# Patient Record
Sex: Female | Born: 1965 | Race: White | Hispanic: No | Marital: Married | Smoking: Current some day smoker
Health system: Southern US, Community
[De-identification: ages and names within clinical notes are randomized; demographics above are authoritative.]

## PROBLEM LIST (undated history)

## (undated) DIAGNOSIS — L659 Nonscarring hair loss, unspecified: Secondary | ICD-10-CM

## (undated) DIAGNOSIS — F909 Attention-deficit hyperactivity disorder, unspecified type: Secondary | ICD-10-CM

---

## 1998-06-12 ENCOUNTER — Encounter: Payer: Self-pay | Admitting: Internal Medicine

## 1998-06-12 ENCOUNTER — Emergency Department (HOSPITAL_COMMUNITY): Admission: EM | Admit: 1998-06-12 | Discharge: 1998-06-12 | Payer: Self-pay | Admitting: Internal Medicine

## 1998-12-22 ENCOUNTER — Other Ambulatory Visit: Admission: RE | Admit: 1998-12-22 | Discharge: 1998-12-22 | Payer: Self-pay | Admitting: Obstetrics and Gynecology

## 1999-02-28 ENCOUNTER — Ambulatory Visit (HOSPITAL_COMMUNITY): Admission: RE | Admit: 1999-02-28 | Discharge: 1999-02-28 | Payer: Self-pay | Admitting: Obstetrics and Gynecology

## 1999-02-28 ENCOUNTER — Encounter (INDEPENDENT_AMBULATORY_CARE_PROVIDER_SITE_OTHER): Payer: Self-pay

## 1999-03-15 ENCOUNTER — Other Ambulatory Visit: Admission: RE | Admit: 1999-03-15 | Discharge: 1999-03-15 | Payer: Self-pay | Admitting: Obstetrics and Gynecology

## 1999-12-13 ENCOUNTER — Encounter: Payer: Self-pay | Admitting: Obstetrics and Gynecology

## 1999-12-13 ENCOUNTER — Ambulatory Visit (HOSPITAL_COMMUNITY): Admission: RE | Admit: 1999-12-13 | Discharge: 1999-12-13 | Payer: Self-pay | Admitting: Obstetrics and Gynecology

## 2000-08-19 ENCOUNTER — Other Ambulatory Visit: Admission: RE | Admit: 2000-08-19 | Discharge: 2000-08-19 | Payer: Self-pay | Admitting: Obstetrics and Gynecology

## 2000-09-22 ENCOUNTER — Ambulatory Visit (HOSPITAL_COMMUNITY): Admission: RE | Admit: 2000-09-22 | Discharge: 2000-09-22 | Payer: Self-pay | Admitting: Obstetrics and Gynecology

## 2000-09-22 ENCOUNTER — Encounter (INDEPENDENT_AMBULATORY_CARE_PROVIDER_SITE_OTHER): Payer: Self-pay | Admitting: Specialist

## 2002-07-13 ENCOUNTER — Encounter: Admission: RE | Admit: 2002-07-13 | Discharge: 2002-07-13 | Payer: Self-pay | Admitting: *Deleted

## 2002-07-13 ENCOUNTER — Encounter: Payer: Self-pay | Admitting: Obstetrics and Gynecology

## 2002-07-28 ENCOUNTER — Other Ambulatory Visit: Admission: RE | Admit: 2002-07-28 | Discharge: 2002-07-28 | Payer: Self-pay | Admitting: Obstetrics and Gynecology

## 2002-08-24 ENCOUNTER — Ambulatory Visit (HOSPITAL_COMMUNITY): Admission: RE | Admit: 2002-08-24 | Discharge: 2002-08-24 | Payer: Self-pay | Admitting: Obstetrics and Gynecology

## 2006-08-11 ENCOUNTER — Encounter: Admission: RE | Admit: 2006-08-11 | Discharge: 2006-08-11 | Payer: Self-pay | Admitting: Obstetrics and Gynecology

## 2006-08-14 ENCOUNTER — Emergency Department: Payer: Self-pay | Admitting: Unknown Physician Specialty

## 2007-11-10 ENCOUNTER — Encounter: Admission: RE | Admit: 2007-11-10 | Discharge: 2007-11-10 | Payer: Self-pay | Admitting: Obstetrics and Gynecology

## 2008-04-06 ENCOUNTER — Emergency Department (HOSPITAL_COMMUNITY): Admission: EM | Admit: 2008-04-06 | Discharge: 2008-04-06 | Payer: Self-pay | Admitting: Emergency Medicine

## 2008-11-22 ENCOUNTER — Encounter: Admission: RE | Admit: 2008-11-22 | Discharge: 2008-11-22 | Payer: Self-pay | Admitting: Obstetrics and Gynecology

## 2009-01-23 ENCOUNTER — Ambulatory Visit (HOSPITAL_COMMUNITY): Admission: RE | Admit: 2009-01-23 | Discharge: 2009-01-23 | Payer: Self-pay | Admitting: Obstetrics and Gynecology

## 2009-01-23 ENCOUNTER — Encounter (INDEPENDENT_AMBULATORY_CARE_PROVIDER_SITE_OTHER): Payer: Self-pay | Admitting: Obstetrics and Gynecology

## 2010-04-23 ENCOUNTER — Encounter: Payer: Self-pay | Admitting: Obstetrics and Gynecology

## 2010-07-05 LAB — CBC
Hemoglobin: 12.8 g/dL (ref 12.0–15.0)
MCHC: 33.5 g/dL (ref 30.0–36.0)
MCV: 89 fL (ref 78.0–100.0)
RBC: 4.29 MIL/uL (ref 3.87–5.11)

## 2010-08-17 NOTE — Op Note (Signed)
NAME:  Jenna Cochran, Jenna Cochran                          ACCOUNT NO.:  [id number]   MEDICAL RECORD NO.:  [id number]                   PATIENT TYPE:  AMB   LOCATION:  SDC                                  FACILITY:  [facility]   PHYSICIAN:  Lenoard Aden, M.D.             DATE OF BIRTH:  February 18, 1966   DATE OF PROCEDURE:  08/24/2002  DATE OF DISCHARGE:                                 OPERATIVE REPORT   CHIEF COMPLAINT:  Pelvic pain and dysmenorrhea.   PREOPERATIVE DIAGNOSES:  Recurrent pelvic pain, dysmenorrhea and  dyspareunia.   POSTOPERATIVE DIAGNOSES:  Pelvic endometriosis, left paratubal cyst, left  ovarian to sigmoid mesentery adhesions, cul-de-sac endometriosis, right  ovarian endometriosis.   FINDINGS:  Left paratubal cyst, normal tubes, left ovary normal with  endometriosis surface and adhesed to the left sigmoid mesentery, right ovary  slightly adherent to the ovarian fossa with endometriosis, normal left tube,  small subserosal uterine fibroid in the posterior wall, violin string like  adhesions of the lower pole of the uterus to the posterior cul-de-sac and  left cul-de-sac endometriosis.   SURGEON:  Lenoard Aden, M.D.   ANESTHESIA:  General.   ESTIMATED BLOOD LOSS:  Less than 50 mL.   COMPLICATIONS:  None.   DRAINS:  None.   COUNTS:  Correct.   PATIENT CONDITION:  Good.   BRIEF OPERATIVE NOTE:  After being apprised of the risks from anesthesia,  infection, bleeding, injury to abdominal organs and the need for repair the  patient was brought to the operating room where she was administered general  anesthetic without complications.  She was prepped and draped in the usual  sterile fashion and catheterized until the bladder was empty with an 8 Cohen  cannula.  A single tooth tenaculum was placed per vagina and retroflexed and  the uterus noted.  After achieving adequate anesthesia dilute Marcaine  solution was placed and the infraumbilical incision made with the  scalpel.  The Veress needle was placed to an opening pressure of -2 noted.  Three and  a half liters of C02 was insufflated without difficulty. Atraumatic 8 mm  trocar placement and pictures taken.  Normal liver, gallbladder bed and  normal appendix visualized with pictures taken. The uterus was as previously  described.  At this time two 5 mm trocar sites were made in the lower  portion of the abdomen under dilute Marcaine solution without trauma.  At  this time the left ovary is noted to be adherent to the left sigmoid  mesentery.  The ovary is elevated and these adhesions are lysed right at the  level of the ovary.  There is a small amount of bleeding along the sigmoid  mesentery which is cauterized using the Kleppinger noting the bowel to be of  great distance from the cauterization process.  Good hemostasis is noted.  Irrigation is accomplished.  There is evidence of left ovarian  endometriosis  which is cauterized and revealing some chocolaty brown material.  It is  completely cauterized down to the base.  No further endometrioma was noted  on the left ovary.  There is evidence of left cul-de-sac endometriosis which  is away from the left uterosacral ligament and just distal and caudad to the  ureter. This is cauterized using Kleppinger bipolar cautery noting the  ureter throughout the process to be out of the field of cauterization. At  this time there are posterior wall adhesions to the back wall of the uterus  which were adhesed and lysed using the Kleppinger bipolar cautery.  Good  hemostasis is noted.  There is a left paratubal cyst distorting the  fimbriated end of the tube.  This is cauterized at its apex, opened, drained  and excised without compromising the tube.  Good hemostasis is noted.  At  this time the right ovary is then visualized and the wall of the proximal  portion of the ovary where the ovary meets the ovarian ligament has bluish  implants consistent with  endometriosis which are cauterized using Kleppinger  bipolar cautery without difficulty.  At this time then the ovary's posterior  wall is seen and there were adhesions via chocolaty purplish endometriotic  implants to the ovarian fossa.  The ureter is identified and these implants  are cauterized and excised revealing syrupy chocolaty material.  Good  hemostasis is achieved.  No further implants are noted on the right ovary.  There is a small non-obstructed paratubal cyst next to the right tube.  No  anterior cul-de-sac endometriosis is noted.  Irrigation is accomplished.  C02 is released and re-insufflation of C02 reveals no evidence of cooling or  bleeding. Both ureters are identified during the entirety of the process.  All instruments are then removed under direct visualization.  The C02 is  released.  The incisions are closed using 0 Vicryl and Dermabond.  The  instruments are removed from the vagina.  The patient tolerates the  procedure well and is transferred to the recovery room in good condition.                                               Lenoard Aden, M.D.    RJT/MEDQ  D:  08/24/2002  T:  08/24/2002  Job:  [id number]

## 2010-08-17 NOTE — H&P (Signed)
[facility]  Patient:    Jenna Cochran                        MRN: [id number] Adm. Date:  [id number] Attending:  Lenoard Aden                         History and Physical  CHIEF COMPLAINT:              Recurrent pregnancy loss.  HISTORY OF PRESENT ILLNESS:   The patient is a 45 year old white female, G4, P1-0-3-1, with recurrent pregnancy loss.  History of positive anticardiolipin antibodies, history of spontaneous miscarriages with normal chromosomes. Hysterosalpingogram with questionable endometrial mass.  GYN HISTORY:                  Remarkable for endometriosis with laparoscopy in 990 and 1997.  Dilatation and curettage in 1996, 1997 and 1998.  PREGNANCY HISTORY:            As noted, three spontaneous pregnancy losses, abnormal HSG and one uncomplicated vaginal delivery.  SOCIAL HISTORY:               The patient smokes five cigarettes a day.  She denies domestic or physical violence.  MEDICATIONS:                  She takes no medications.  ALLERGIES:                    No known drug allergies.  PHYSICAL EXAMINATION:  GENERAL:                      The patient is a well-developed, well-nourished, white female in no apparent distress.  HEENT:                        Normal.  LUNGS:                        Clear.  HEART:                        Regular rate and rhythm.  ABDOMEN:                      Soft and nontender.  Uterus is enlarged and retroverted.  No adnexal masses are appreciated.  IMPRESSION:                   Recurrent pregnancy loss with questionable structural                               defect in the uterus with negative                               hysterosalpingogram and abnormal saline                               sonohysterogram.  PLAN:                         To proceed with the diagnostic hysteroscopy, resectoscopic resection.  The risks of anesthesia, infection, bleeding, uterine  perforation, were  discussed with postoperative risks requiring repair noted. The patient acknowledges and desires to proceed. DD:  02/28/99 TD:  02/28/99 Job: [id number]

## 2010-08-17 NOTE — H&P (Signed)
   NAME:  Jenna Cochran, Jenna Cochran                          ACCOUNT NO.:  [id number]   MEDICAL RECORD NO.:  [id number]                   PATIENT TYPE:  AMB   LOCATION:  SDC                                  FACILITY:  [facility]   PHYSICIAN:  Lenoard Aden, M.D.             DATE OF BIRTH:  09-13-1965   DATE OF ADMISSION:  08/24/2002  DATE OF DISCHARGE:                                HISTORY & PHYSICAL   CHIEF COMPLAINT:  Pelvic pain, dysmenorrhea, dyspareunia and history of  endometriosis.   HISTORY OF PRESENT ILLNESS:  The patient is a 45 year old, white female, G5,  P1 with a history of clinical criteria for antiphospholipid antibody  syndrome who presents with worsening dyspareunia, dysmenorrhea and pelvic  pain.  The patient has a history of laparoscopic ablation of endometriosis  in June 2002.  She had the previous history of positive anticardiolipin  antibodies and previous history of laparoscopy in 1997 and 1990, for  conservative laparoscopic ablation of endometriosis.  She had D&Cs done in  1996, 1997 and 1998, for first-trimester pregnancy loss.  Her pregnancy is  remarkable for three uncomplicated first-trimester pregnancy losses and one  uncomplicated delivery.   MEDICATIONS:  Celexa.   ALLERGIES:  No known drug allergies.   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   SOCIAL HISTORY:  She is a 1/4 pack per day smoker.  She denies domestic or  physical violence.   PHYSICAL EXAMINATION:  GENERAL:  She is a well-developed, well-nourished,  white female in no apparent distress.  HEENT:  Normal.  LUNGS:  Clear.  HEART:  Regular rate and rhythm.  ABDOMEN:  Soft, nontender.  PELVIC:  Normal size and retroflexed uterus with no adnexal masses.  EXTREMITIES:  No cords.  NEUROLOGIC:  Nonfocal.   IMPRESSION:  Recurrent dysmenorrhea and dyspareunia, rule out recurrent  endometriosis.  Desires fertility with inability  to conceive.   PLAN:  Proceed with diagnostic laparoscopy, ablation of endometriosis.  The  risks of anesthesia, infection, bleeding, injury of abdominal organs and  need for repair is discussed.  Delayed versus immediate complications to  include bowel and bladder injury noted, inability to cure pelvic pain,  inability to ablate endometriosis over the long-term is discussed.  The  patient acknowledges these issues and wishes to proceed.                                              Lenoard Aden, M.D.   RJT/MEDQ  D:  08/24/2002  T:  08/24/2002  Job:  [id number]

## 2010-08-17 NOTE — Op Note (Signed)
[facility]  Patient:    Jenna Cochran                        MRN: [id number] Proc. Date: 02/28/99 Adm. Date:  [id number] Attending:  Lenoard Aden                           Operative Report  PREOPERATIVE DIAGNOSIS:  Recurrent pregnancy loss.  Questionable endometrial mass on saline sono-hysterogram.  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  PROCEDURES:  Diagnostic hysteroscopy, resection of uterine septum, and D&C.  SURGEON:  Lenoard Aden, M.D.  ANESTHESIA:  General.  ESTIMATED BLOOD LOSS:  Less than 50 cc.  COMPLICATIONS:  None.  FLUID DEFICIT:  200 cc.  DISPOSITION:  The patient to the recovery room in good condition.  BRIEF OPERATIVE NOTE:  After being apprised of the risks of anesthesia, infection, bleeding, uterine perforation, and the need for repair, the patient was brought to the operating room where she was prepped and draped in the usual sterile fashion. Her feet were placed in the elephant stirrups, and she was catheterized until her bladder was empty.  Exam under anesthesia revealed a normal sized retroflexed uterus and no adnexal masses.  A single-tooth tenaculum was used to grasp the cervix after a weighted speculum was placed.  A ______ solution 18 cc placed at  the 9 and 3 oclock at the cervicovaginal junction atraumatically.  At this time, the cervix was dilated up to a #27 Pratt dilator, and a diagnostic hysteroscope was placed.  Visualization revealed two distinct cornual areas followed by a ridge f tissue that projects about a quarter of the way to half of the way into the uterine cavity.  The diagnostic hysteroscope was removed.  The resectoscope was placed after dilating up to a #31 Pratt dilator with minimal difficulty.  No evidence f uterine perforations were noted.  The hysteroscope was replaced, and using the double loop, sweeping horizontally across the septum until both ______  were visualized.  Using the hysteroscope, this septum was removed and sent to pathology for confirmation.  Afterwards, good hemostasis was noted.  Bilateral ______ were noted.  The instruments were removed.  The patient tolerated the procedure well and was transferred to the recovery room in good condition.  A fluid deficit of 200 cc was noted. DD:  02/28/99 TD:  03/01/99 Job: [id number]

## 2010-08-17 NOTE — H&P (Signed)
[facility]  Patient:    Jenna Cochran, Jenna Cochran                       MRN: [id number] Adm. Date:  [id number] Attending:  Lenoard Aden                         History and Physical  CHIEF COMPLAINT:              Worsening dysmenorrhea and dyspareunia.  HISTORY OF PRESENT ILLNESS:   The patient is a 45 year old white female, G 4, P 1, 0, 3, 1, with a history of recurrent pregnancy loss, positive anticardiolipin antibodies, a history of endometriosis, status post laparoscopy in 1990, and 1997, for a laparoscopic ablation of endometriosis, who presents for conservative therapy, and a GYN history remarkable for a laparoscopy in 1997, as mentioned above, a D&C in 1996, 1997, and 1998, for a pregnancy loss.  PREGNANCY HISTORY:            Three first trimester pregnancy losses, one uncomplicated vaginal delivery.  SOCIAL HISTORY:               The patient is a 1/4-pack-a-day smoker.  She denies domestic or physical violence.  CURRENT MEDICATIONS:          Prozac 20 mg q.d.  PAST MEDICAL HISTORY:         Other medical problems remarkable for circumstantial depression.  PHYSICAL EXAMINATION:  GENERAL:                      She is a well-developed, well-nourished white female, in no apparent distress.  HEENT:                        Normal.  LUNGS:                        Clear.  HEART:                        A regular rate and rhythm.  ABDOMEN:                      Soft, scaphoid, nontender.  PELVIC:                       A retroflexed uterus and bilateral tenderness, right greater than left.  No masses are appreciated.  EXTREMITIES:                  No cords.  NEUROLOGIC:                   Nonfocal.  IMPRESSION:                   Recurrent dyspareunia and dyspareunia in a                               patient with known history of endometriosis and                               recurrent pregnancy loss.  PLAN:  Proceed with  a diagnostic laparoscopy, possible ablation of endometriosis.  The risks of anesthesia, infection, bleeding, injury to abdominal organs, and need for repair were discussed.  Delayed, versus immediate complications, to include bowel and bladder injury were noted, and the inability to cure the endometriosis via a conservative surgery were discussed with the patient and with her husband.  They acknowledge and desire to proceed in lieu of their attempts to try to get pregnant. DD:  09/22/00 TD:  09/22/00 Job: [id number]

## 2010-08-17 NOTE — Op Note (Signed)
[facility]  Patient:    Jenna Cochran, Jenna Cochran                       MRN: [id number] Proc. Date: 09/22/00 Adm. Date:  [id number] Attending:  Lenoard Aden                           Operative Report  PREOPERATIVE DIAGNOSES:       1. Cyclic pelvic pain.                               2. Menorrhagia.                               3. Dysmenorrhea.                               4. Dyspareunia.  POSTOPERATIVE DIAGNOSES:      1. Recurrent pelvic endometriosis.                               2. Pelvic adhesions.  OPERATION:                    1. Diagnostic laparoscopy.                               2. Ablation of endometriosis.                               3. Lysis of adhesions.                               4. Right ovarian cystectomy.  SURGEON:                      Lenoard Aden, M.D.  ANESTHESIA:                   General.  ESTIMATED BLOOD LOSS:         Less than 50 cc.  COMPLICATIONS:                None.  COUNTS:                       Correct.  DISPOSITION:                  Patient to recovery in good condition.  FINDINGS:                     Cul-de-sac endometriotic implants, right and left ovarian surface endometriosis, right ovarian follicular cyst, left pelvic sidewall endometriosis, left sigmoid to ovarian adhesions, bilateral normal tubes, small subserosal fibroid.  DESCRIPTION OF PROCEDURE:     The patient was brought to the operating room where she is prepped and draped in the usual sterile fashion after being placed under general anesthesia. After achieving an adequate anesthesia, examination under anesthesia reveals a retroflexed uterus, no adnexal masses. An infraumbilical incision is then made with the scalpel, Veress needle is placed, hanging  drop test consistent with intraperitoneal entry. At this time Veress needle placed and 5 liters of CO2 insufflated without difficulty, patient pressure having been set to 25. After easy  insufflation of the CO2, trocar is placed. Diagnostic scope is placed. Visualization reveals normal liver/gallbladder bed, normal appendix, normal-appearing left ovary which is adhesed to the left sigmoid mesentery as previously noted, and up into the left pelvic sidewall, there are adhesions and implants of endometriosis, endometriotic implants. After making 5 mm incision sites and placement of atraumatic placement of 5 mm trocars bilaterally in the lower quadrants, CO2 is released and down to a patient pressure which is reset to 15. At this time, Kleppinger bipolar cautery is used in conjunction with an atraumatic grasper to cauterize all endometriotic implants along the left pelvic sidewall, posterior and anterior cul-de-sac, right and left ovarian surfaces. At this time, the left ovary is then grasped and tented such that the adhesions could be easily lysed between the left ovary and the sigmoid adhesions. However, there were thicker adhesions along the most distal portion of the ovary which were contiguous with the bowel, no easy plane was noted. These were bluntly dissected down to the level of the pelvic brim, however, complete dissection was unable to be performed due to poor surgical planes and presence of pelvic vascular structures. At this time, both tubes are visualized. There is a small left paratubal cyst noted. Normal appendix, no evidence of anterior abdominal wall adhesions or endometriosis. Right ovary is then grasped, superficial endometriosis having been cauterized, there is a    2 cm cyst on the right ovary which is cauterized when surface endometrium is cauterized, clear fluid contents are noted. Fluid is aspirated, peritoneal fluid is sent for cytology and then surface endometriosis is cauterized as well as the cyst bed is dissected, cauterized, and removed. After this, good hemostasis is achieved. Irrigation is accomplished. All instruments are removed under  direct visualization, CO2 released. Incisions closed using 0 and 4-0 Vicryl. Hulka tenaculum removed from the vagina. Patient tolerates the procedure well and transferred to recovery room in good condition. DD:  09/22/00 TD:  09/23/00 Job: [id number]

## 2010-09-20 ENCOUNTER — Ambulatory Visit (HOSPITAL_BASED_OUTPATIENT_CLINIC_OR_DEPARTMENT_OTHER)
Admission: RE | Admit: 2010-09-20 | Discharge: 2010-09-21 | Disposition: A | Discharge status: Home / Self Care | Payer: 59 | Source: Ambulatory Visit | Attending: Urology | Admitting: Urology

## 2010-09-20 DIAGNOSIS — F430 Acute stress reaction: Secondary | ICD-10-CM | POA: Insufficient documentation

## 2010-09-20 DIAGNOSIS — N3010 Interstitial cystitis (chronic) without hematuria: Secondary | ICD-10-CM | POA: Insufficient documentation

## 2010-09-20 DIAGNOSIS — N393 Stress incontinence (female) (male): Secondary | ICD-10-CM | POA: Insufficient documentation

## 2010-09-20 DIAGNOSIS — Z01812 Encounter for preprocedural laboratory examination: Secondary | ICD-10-CM | POA: Insufficient documentation

## 2010-09-21 NOTE — Op Note (Signed)
NAME:  Jenna Cochran, Jenna Cochran                ACCOUNT NO.:  [id number]  MEDICAL RECORD NO.:  [id number]  LOCATION:                                 FACILITY:  PHYSICIAN:  Redding Cloe I. Patsi Sears, M.D.DATE OF BIRTH:  09-19-1965  DATE OF PROCEDURE:  09/21/2010 DATE OF DISCHARGE:                              OPERATIVE REPORT   PREOPERATIVE DIAGNOSIS:  Stress urinary incontinence and interstitial cystitis.  POSTOPERATIVE DIAGNOSIS:  Stress urinary incontinence and interstitial cystitis.  OPERATION:  Cystourethroscopy, hydrodistention of bladder (500 cc), with [place] pubovaginal sling.  SURGEON:  Vaida Kerchner I. Patsi Sears, M.D.  ANESTHESIA:  General LMA.  PREOPERATIVE PREPARATION:  After appropriate preanesthesia, the patient is brought to the operating room, placed on the operating table in dorsal supine position where general LMA anesthesia was introduced.  She was then re-placed in a dorsal lithotomy position where the pubis was prepped with Betadine solution and draped in usual fashion.  BRIEF HISTORY:  This is a 45 year old female, is para 4-1-3 with stress urinary incontinence, as well as urinary urgency, recurrent bladder infection with ammonia odor of her urine.  She had urodynamics, which showed her leak point pressure to be approximately 100 cm of water, flow rate of 16 cc per second.  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  She is now for cysto and hydrodistention, as well as injection of Kenalog and Marcaine in the subtrigonal space, and insertion of Pyridium in the bladder and Lynx sling for her stress incontinence symptoms.  PROCEDURE IN DETAIL:  With the patient in lithotomy position, outline of the midurethra was undertaken with the marking pen, and the bladder neck was also marked after Foley catheter was placed and 10 mL placed in the balloon.  Two areas 2 fingerbreadths above the pubic tubercle and lateral to the pubic tubercle were marked with a marking pen as  well. The horseshoe retractor was placed for visualization purposes.  Cystourethroscopy was accomplished, showing normal urethra, and a lax bladder neck. The trigone was normal, wiht normal ureteral orifices, and clear efflux from both orifices.There was no evidence of bladder stone, tumor, or diverticulum.The bladder was  hydrodistended to 550cc. No ulcerations were identified in the mucosa. No bleeding ws noted. The baldder was drained, and a foley catheter was then placed with 10cc in the baloon.    A 15 mm incision was made in the midurethra.  Subcutaneous tissue was dissected on either side of the urethra to the level of the retropubic space.  Two incisions were then made in the areas marked in the suprapubic space, and the needle trocars were placed for the [employer] sling.  Cystoscopy was accomplished, which shows the 2 trocars to be close to the bladder wall and I decided to replace both trocars.  These were then replaced, repeat cystoscopy showed excellent placement of the needles with no involvement of the mucosa of the bladder.  Clear efflux was seen from both orifices.  The [place] sling was then placed, brought it through the retropubic incisions bilaterally with no twisting.  There was excellent coverage of the midurethra with smooth mesh against the urethra.  Right-angle clamp was used for tensioning during  the procedure.  The plastic cover was cut, and removed again with tensioning with a right-angle clamp.  Following this, the arms of the sling are cut with a right-angle clamp behind the mesh and then the wound was irrigated with antibiotic irrigation.  Pyridium was inserted through the Foley into the bladder, and Marcaine/Kenalog was injected in the subtrigonal space. The wound was closed with 2-0 Vicryl suture in a single layer and a vaginal packing was placed.  The patient received IV Toradol, was awakened, and taken to recovery room in good  condition.     Ilee Randleman I. Patsi Sears, M.D.     SIT/MEDQ  D:  09/20/2010  T:  09/20/2010  Job:  [id number]  Electronically Signed by Jethro Bolus M.D. on 09/21/2010 05:18:11 PM

## 2011-06-25 ENCOUNTER — Other Ambulatory Visit: Payer: Self-pay | Admitting: Occupational Medicine

## 2011-06-25 ENCOUNTER — Ambulatory Visit: Payer: Self-pay

## 2011-06-25 DIAGNOSIS — R52 Pain, unspecified: Secondary | ICD-10-CM

## 2014-04-06 DIAGNOSIS — L658 Other specified nonscarring hair loss: Secondary | ICD-10-CM | POA: Insufficient documentation

## 2014-09-16 ENCOUNTER — Ambulatory Visit (INDEPENDENT_AMBULATORY_CARE_PROVIDER_SITE_OTHER): Payer: 59 | Admitting: Family Medicine

## 2014-09-16 ENCOUNTER — Encounter: Payer: Self-pay | Admitting: Family Medicine

## 2014-09-16 VITALS — BP 117/84 | HR 96 | Temp 98.0°F | Resp 16 | Ht 61.0 in | Wt 154.0 lb

## 2014-09-16 DIAGNOSIS — S21222A Laceration with foreign body of left back wall of thorax without penetration into thoracic cavity, initial encounter: Secondary | ICD-10-CM | POA: Diagnosis not present

## 2014-09-16 DIAGNOSIS — F419 Anxiety disorder, unspecified: Secondary | ICD-10-CM | POA: Insufficient documentation

## 2014-09-16 MED ORDER — CEPHALEXIN 500 MG PO CAPS: 500.0000 mg | ORAL_CAPSULE | Freq: Four times a day (QID) | ORAL | Status: AC

## 2014-09-16 NOTE — Progress Notes (Signed)
Subjective:    Patient ID: Jenna Cochran, female    DOB: 1966/01/29, 49 y.o.   MRN: [id number]  HPI: Jenna Cochran is a 49 y.o. female presenting on 09/16/2014 for Puncture Wound   HPI   Pt presents for nail wound on back that occurred on 09/11/2014.  She was walking down the stairs of her house in the basement and hit a nail.  The wound in in the center of her back. Pt has been treating with with peroxide and neosporin at home. She is UTD on tetnus. Wound is draining purulent fluid. Wound is normally covered. Pt fiance is helping her to care for wound and describes it as getting very red and the redness is spreading. Pt reports that the wound is now throbbing- this is a new symptoms. No fevers or chills at home.   History reviewed. No pertinent past medical history.  No current outpatient prescriptions on file prior to visit.   No current facility-administered medications on file prior to visit.    Review of Systems  Constitutional: Negative for fever and chills.  Cardiovascular: Negative for chest pain, palpitations and leg swelling.  Musculoskeletal: Negative for neck pain and neck stiffness.  Skin: Positive for color change and wound.  Neurological: Negative for dizziness, seizures, facial asymmetry, weakness and numbness.  Psychiatric/Behavioral: Negative.    Per HPI unless specifically indicated above     Objective:    BP 117/84 mmHg  Pulse 96  Temp(Src) 98 F (36.7 C) (Oral)  Resp 16  Ht 5\' 1"  (1.549 m)  Wt 154 lb (69.854 kg)  BMI 29.11 kg/m2  LMP 08/29/2014  Wt Readings from Last 3 Encounters:  09/16/14 154 lb (69.854 kg)    Physical Exam  Constitutional: She appears well-developed and well-nourished. No distress.  Cardiovascular: Normal rate and regular rhythm.  Exam reveals no gallop and no friction rub.   No murmur heard. Skin: Skin is warm and dry. Bruising and laceration (no exudate. tender to palpation, surrounding erythema) noted. She is not diaphoretic.  There is erythema.      Results for orders placed or performed during the hospital encounter of 09/20/10  Hemoglobin-hemacue, POC  Result Value Ref Range   Hemoglobin 14.1 12.0 - 15.0 g/dL  Pregnancy, urine POC  Result Value Ref Range   Preg Test, Ur NEGATIVE       Assessment & Plan:   Problem List Items Addressed This Visit    None    Visit Diagnoses    Laceration of left back wall of thorax with foreign body without penetration into thoracic cavity, initial encounter    -  Primary    Wound care performed. Keflex prophylaxis due to drainage. Triple ointment daily. Keep covered and moist. Alarm symptoms reviewed.    Relevant Medications    cephALEXin (KEFLEX) 500 MG capsule       Meds ordered this encounter  Medications  . ALPRAZolam (XANAX) 1 MG tablet    Sig:   . amphetamine-dextroamphetamine (ADDERALL) 20 MG tablet    Sig: Take by mouth.  . ARIPiprazole (ABILIFY) 5 MG tablet    Sig: Take by mouth.  . Eszopiclone 3 MG TABS    Sig: Take by mouth.  . cephALEXin (KEFLEX) 500 MG capsule    Sig: Take 1 capsule (500 mg total) by mouth 4 (four) times daily.    Dispense:  15 capsule    Refill:  0    Order Specific Question:  Supervising Provider  Answer:  Arlis Porta [[id number]]      Follow up plan: Return if symptoms worsen or fail to improve.

## 2014-09-16 NOTE — Patient Instructions (Addendum)
Wound care: Keep moist and covered. Apply triple ointment daily. Call for spreading redness, fever, or chills.  Puncture Wound A puncture wound is an injury that extends through all layers of the skin and into the tissue beneath the skin (subcutaneous tissue). Puncture wounds become infected easily because germs often enter the body and go beneath the skin during the injury. Having a deep wound with a small entrance point makes it difficult for your caregiver to adequately clean the wound. This is especially true if you have stepped on a nail and it has passed through a dirty shoe or other situations where the wound is obviously contaminated. CAUSES  Many puncture wounds involve glass, nails, splinters, fish hooks, or other objects that enter the skin (foreign bodies). A puncture wound may also be caused by a human bite or animal bite. DIAGNOSIS  A puncture wound is usually diagnosed by your history and a physical exam. You may need to have an X-ray or an ultrasound to check for any foreign bodies still in the wound. TREATMENT   Your caregiver will clean the wound as thoroughly as possible. Depending on the location of the wound, a bandage (dressing) may be applied.  Your caregiver might prescribe antibiotic medicines.  You may need a follow-up visit to check on your wound. Follow all instructions as directed by your caregiver. HOME CARE INSTRUCTIONS   Change your dressing once per day, or as directed by your caregiver. If the dressing sticks, it may be removed by soaking the area in water.  If your caregiver has given you follow-up instructions, it is very important that you return for a follow-up appointment. Not following up as directed could result in a chronic or permanent injury, pain, and disability.  Only take over-the-counter or prescription medicines for pain, discomfort, or fever as directed by your caregiver.  If you are given antibiotics, take them as directed. Finish them even if  you start to feel better. You may need a tetanus shot if:  You cannot remember when you had your last tetanus shot.  You have never had a tetanus shot. If you got a tetanus shot, your arm may swell, get red, and feel warm to the touch. This is common and not a problem. If you need a tetanus shot and you choose not to have one, there is a rare chance of getting tetanus. Sickness from tetanus can be serious. You may need a rabies shot if an animal bite caused your puncture wound. SEEK MEDICAL CARE IF:   You have redness, swelling, or increasing pain in the wound.  You have red streaks going away from the wound.  You notice a bad smell coming from the wound or dressing.  You have yellowish-white fluid (pus) coming from the wound.  You are treated with an antibiotic for infection, but the infection is not getting better.  You notice something in the wound, such as rubber from your shoe, cloth, or another object.  You have a fever.  You have severe pain.  You have difficulty breathing.  You feel dizzy or faint.  You cannot stop vomiting.  You lose feeling, develop numbness, or cannot move a limb below the wound.  Your symptoms worsen. MAKE SURE YOU:  Understand these instructions.  Will watch your condition.  Will get help right away if you are not doing well or get worse. Document Released: 12/26/2004 Document Revised: 06/10/2011 Document Reviewed: 09/04/2010 [facility] Patient Information 2015 [place], [place]. This information is not intended  to replace advice given to you by your health care provider. Make sure you discuss any questions you have with your health care provider.  

## 2014-12-29 ENCOUNTER — Ambulatory Visit
Admission: EM | Admit: 2014-12-29 | Discharge: 2014-12-29 | Disposition: A | Discharge status: Home / Self Care | Payer: 59 | Attending: Family Medicine | Admitting: Family Medicine

## 2014-12-29 ENCOUNTER — Ambulatory Visit
Admit: 2014-12-29 | Discharge: 2014-12-29 | Disposition: A | Discharge status: Home / Self Care | Payer: 59 | Attending: Family Medicine | Admitting: Family Medicine

## 2014-12-29 DIAGNOSIS — F909 Attention-deficit hyperactivity disorder, unspecified type: Secondary | ICD-10-CM | POA: Diagnosis not present

## 2014-12-29 DIAGNOSIS — Y9364 Activity, baseball: Secondary | ICD-10-CM | POA: Diagnosis not present

## 2014-12-29 DIAGNOSIS — S0083XA Contusion of other part of head, initial encounter: Secondary | ICD-10-CM | POA: Diagnosis not present

## 2014-12-29 DIAGNOSIS — S0511XA Contusion of eyeball and orbital tissues, right eye, initial encounter: Secondary | ICD-10-CM

## 2014-12-29 DIAGNOSIS — R040 Epistaxis: Secondary | ICD-10-CM | POA: Insufficient documentation

## 2014-12-29 DIAGNOSIS — F17210 Nicotine dependence, cigarettes, uncomplicated: Secondary | ICD-10-CM | POA: Insufficient documentation

## 2014-12-29 DIAGNOSIS — W2103XA Struck by baseball, initial encounter: Secondary | ICD-10-CM | POA: Diagnosis not present

## 2014-12-29 DIAGNOSIS — S0590XA Unspecified injury of unspecified eye and orbit, initial encounter: Secondary | ICD-10-CM | POA: Diagnosis present

## 2014-12-29 HISTORY — DX: Attention-deficit hyperactivity disorder, unspecified type: F90.9

## 2014-12-29 HISTORY — DX: Nonscarring hair loss, unspecified: L65.9

## 2014-12-29 NOTE — ED Notes (Signed)
Pt states "I was hit in the right eye with a softball on Saturday. I had a really bad headache and now I have facial pain, like a sinus infection." Right eye bruised. Vision 20/20. Denies LOC with incident.

## 2014-12-29 NOTE — Discharge Instructions (Signed)
Facial or Scalp Contusion  A facial or scalp contusion is a deep bruise on the face or head. Contusions happen when an injury causes bleeding under the skin. Signs of bruising include pain, puffiness (swelling), and discolored skin. The contusion may turn blue, purple, or yellow. HOME CARE  Only take medicines as told by your doctor.  Put ice on the injured area.  Put ice in a plastic bag.  Place a towel between your skin and the bag.  Leave the ice on for 20 minutes, 2-3 times a day. GET HELP IF:  You have bite problems.  You have pain when chewing.  You are worried about your face not healing normally. GET HELP RIGHT AWAY IF:   You have severe pain or a headache and medicine does not help.  You are very tired or confused, or your personality changes.  You throw up (vomit).  You have a nosebleed that will not stop.  You see two of everything (double vision) or have blurry vision.  You have fluid coming from your nose or ear.  You have problems walking or using your arms or legs. MAKE SURE YOU:   Understand these instructions.  Will watch your condition.  Will get help right away if you are not doing well or get worse. Document Released: 03/07/2011 Document Revised: 01/06/2013 Document Reviewed: 10/29/2012 [facility] Patient Information 2015 [place], [place]. This information is not intended to replace advice given to you by your health care provider. Make sure you discuss any questions you have with your health care provider.  Nosebleed A nosebleed can be caused by many things, including:  Getting hit hard in the nose.  Infections.  Dry nose.  Colds.  Medicines. Your doctor may do lab testing if you get nosebleeds a lot and the cause is not known. HOME CARE   If your nose was packed with material, keep it there until your doctor takes it out. Put the pack back in your nose if the pack falls out.  Do not blow your nose for 12 hours after the  nosebleed.  Sit up and bend forward if your nose starts bleeding again. Pinch the front half of your nose nonstop for 20 minutes.  Put petroleum jelly inside your nose every morning if you have a dry nose.  Use a humidifier to make the air less dry.  Do not take aspirin.  Try not to strain, lift, or bend at the waist for many days after the nosebleed. GET HELP RIGHT AWAY IF:   Nosebleeds keep happening and are hard to stop or control.  You have bleeding or bruises that are not normal on other parts of the body.  You have a fever.  The nosebleeds get worse.  You get lightheaded, feel faint, sweaty, or throw up (vomit) blood. MAKE SURE YOU:   Understand these instructions.  Will watch your condition.  Will get help right away if you are not doing well or get worse. Document Released: 12/26/2007 Document Revised: 06/10/2011 Document Reviewed: 12/26/2007 [facility] Patient Information 2015 [place], [place]. This information is not intended to replace advice given to you by your health care provider. Make sure you discuss any questions you have with your health care provider.  Contusion A contusion is a deep bruise. Contusions happen when an injury causes bleeding under the skin. Signs of bruising include pain, puffiness (swelling), and discolored skin. The contusion may turn blue, purple, or yellow. HOME CARE   Put ice on the injured area.  Put ice in a plastic bag.  Place a towel between your skin and the bag.  Leave the ice on for 15-20 minutes, 03-04 times a day.  Only take medicine as told by your doctor.  Rest the injured area.  If possible, raise (elevate) the injured area to lessen puffiness. GET HELP RIGHT AWAY IF:   You have more bruising or puffiness.  You have pain that is getting worse.  Your puffiness or pain is not helped by medicine. MAKE SURE YOU:   Understand these instructions.  Will watch your condition.  Will get help right away if you are  not doing well or get worse. Document Released: 09/04/2007 Document Revised: 06/10/2011 Document Reviewed: 01/21/2011 [facility] Patient Information 2015 [place], [place]. This information is not intended to replace advice given to you by your health care provider. Make sure you discuss any questions you have with your health care provider.

## 2014-12-29 NOTE — ED Provider Notes (Signed)
CSN: [id number]     Arrival date & time 12/29/14  1028 History   First MD Initiated Contact with Patient 12/29/14 1221     Chief Complaint  Patient presents with  . Eye Injury     Patient reports being hit in the right eye with softball Saturday. She was playing catch with her significant other that he threw the ball roll hard and hit her glove and went and here in the right eye. She had headache dizziness after hit her but she is here today because the increased swelling around the right eye and tenderness over the right orbital area. She reports increased nasal congestion and bleeding from the nose last night and because of the symptoms coming worse she wanted to be evaluated. No history of any stiff, but she does think she has had a previous nasal fracture before in the past. (Consider location/radiation/quality/duration/timing/severity/associated sxs/prior Treatment) Patient is a 49 y.o. female presenting with eye injury. The history is provided by the patient. No language interpreter was used.  Eye Injury This is a new problem. The current episode started more than 2 days ago (6 days ago). The problem occurs constantly. The problem has been gradually worsening. Pertinent negatives include no chest pain, no abdominal pain, no headaches and no shortness of breath. Nothing aggravates the symptoms. Nothing relieves the symptoms. She has tried nothing for the symptoms. The treatment provided no relief.    Past Medical History  Diagnosis Date  . Hair loss   . ADHD (attention deficit hyperactivity disorder)    History reviewed. No pertinent past surgical history. Family History  Problem Relation Age of Onset  . Heart disease Mother   . Cancer Father 71    melanoma   Social History  Substance Use Topics  . Smoking status: Current Some Day Smoker -- 0.25 packs/day for 2 years    Types: Cigarettes  . Smokeless tobacco: Never Used  . Alcohol Use: No   OB History    No data available       Past medical history essentially negative she still smokes and she's had a uterine ablation in the recent past so we are not worried about pregnancy at this time Review of Systems  Eyes: Positive for redness.  Respiratory: Negative for shortness of breath.   Cardiovascular: Negative for chest pain.  Gastrointestinal: Negative for abdominal pain.  Neurological: Negative for headaches.  All other systems reviewed and are negative.   Allergies  Review of patient's allergies indicates no known allergies.  Home Medications   Prior to Admission medications   Medication Sig Start Date End Date Taking? Authorizing Provider  ALPRAZolam Prudy Feeler) 1 MG tablet  02/02/14  Yes Historical Provider, MD  amphetamine-dextroamphetamine (ADDERALL) 20 MG tablet Take by mouth. 02/03/14  Yes Historical Provider, MD  citalopram (CELEXA) 40 MG tablet Take 40 mg by mouth daily.   Yes Historical Provider, MD  Eszopiclone 3 MG TABS Take by mouth. 01/20/14  Yes Historical Provider, MD  spironolactone (ALDACTONE) 100 MG tablet Take 200 mg by mouth daily.   Yes Historical Provider, MD  ARIPiprazole (ABILIFY) 5 MG tablet Take by mouth. 12/23/13   Historical Provider, MD   Meds Ordered and Administered this Visit  Medications - No data to display  BP 149/89 mmHg  Pulse 60  Temp(Src) 98.3 F (36.8 C) (Tympanic)  Resp 16  Ht  (1.549 m)  Wt 150 lb (68.04 kg)  BMI 28.36 kg/m2  SpO2 100%  LMP 08/29/2014 No  data found.   Physical Exam  Constitutional: She is oriented to person, place, and time. She appears well-developed and well-nourished.  HENT:  Head: Normocephalic and atraumatic.  Eyes: Conjunctivae are normal. Pupils are equal, round, and reactive to light.  Neck: Normal range of motion.  Musculoskeletal: Normal range of motion.  Neurological: She is alert and oriented to person, place, and time.  Skin: Skin is warm.  Psychiatric: She has a normal mood and affect. Her behavior is normal.   Vitals reviewed.   ED Course  Procedures (including critical care time)  Labs Review Labs Reviewed - No data to display  Imaging Review Ct Maxillofacial Wo Cm  12/29/2014   CLINICAL DATA:  Patient struck in the right hilum with a softball on 12/24/2014. Continued pain. Initial encounter.  EXAM: CT MAXILLOFACIAL WITHOUT CONTRAST  TECHNIQUE: Multidetector CT imaging of the maxillofacial structures was performed. Multiplanar CT image reconstructions were also generated. A small metallic BB was placed on the right temple in order to reliably differentiate right from left.  COMPARISON:  None.  FINDINGS: There is no facial bone fracture. The TMJs are located. There is some degenerative change about the left TMJ with tiny subchondral cysts and some sclerosis in the mandibular condyle identified. The globes are intact and the lenses are located. Orbital fat is clear. Extra-ocular muscles and optic nerves have a normal appearance. Imaged intracranial contents appear normal.  IMPRESSION: No acute abnormality.  Normal-appearing right eye.  Degenerative change about the left TMJ.   Electronically Signed   By: Drusilla Kanner M.D.   On: 12/29/2014 14:15     Visual Acuity Review  Right Eye Distance: 20/20 Left Eye Distance: 20/20 Bilateral Distance: 20/20  Right Eye Near:   Left Eye Near:    Bilateral Near:         MDM   1. Facial contusion, initial encounter   2. Orbital contusion, right, initial encounter   3. Epistaxis      Patient will be sent to [facility] for CT scan of the face to rule out orbital fracture.  CT of the face was negative other than some arthritis on the left side at the TMJ area. Patient was informed of the results and recommended she apply ice the face and to the left eye.  Hassan Rowan, MD 12/29/14 1438

## 2015-01-31 DEATH — deceased

## 2016-02-06 IMAGING — CT CT MAXILLOFACIAL W/O CM
3 series · 16 of 47 positions shown, 19 images · non-contrast
Comparison: None.

CLINICAL DATA: Patient struck in the right hilum with a softball on
12/24/2014. Continued pain. Initial encounter.

EXAM:
CT MAXILLOFACIAL WITHOUT CONTRAST
TECHNIQUE: Multidetector CT imaging of the maxillofacial structures was
performed. Multiplanar CT image reconstructions were also generated.
A small metallic BB was placed on the right temple in order to
reliably differentiate right from left.

[Series 2: max soft · axial · 0.31mm/px · z∈[-130,+16]mm · 10 of 85 slices shown, 13 images]
[im 6/85  brain]
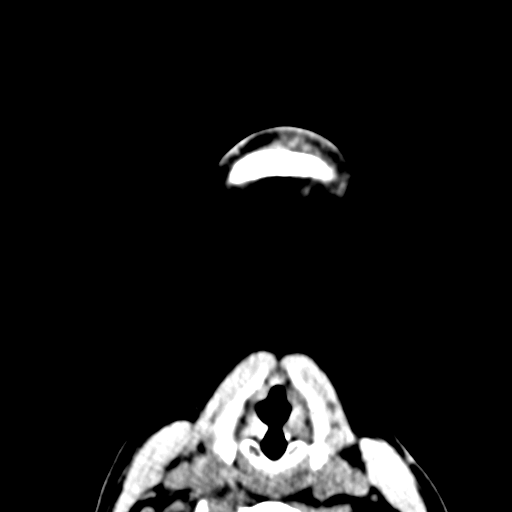
[im 6/85  bone]
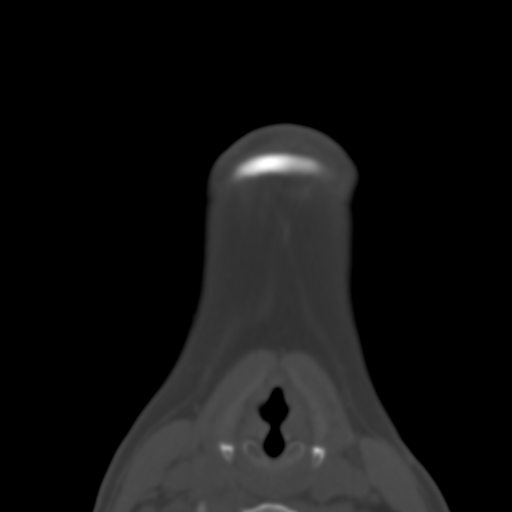
[im 15/85  bone]
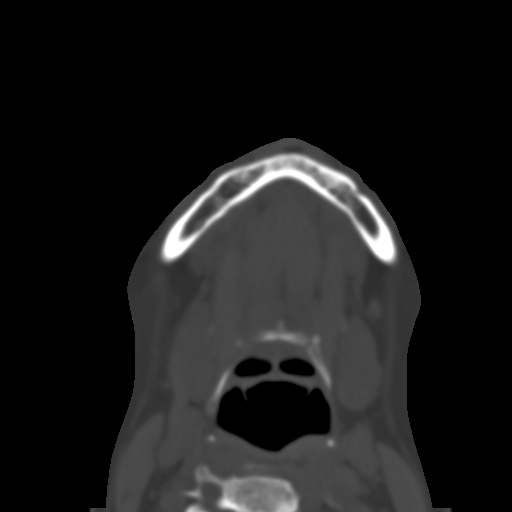
[im 24/85  bone]
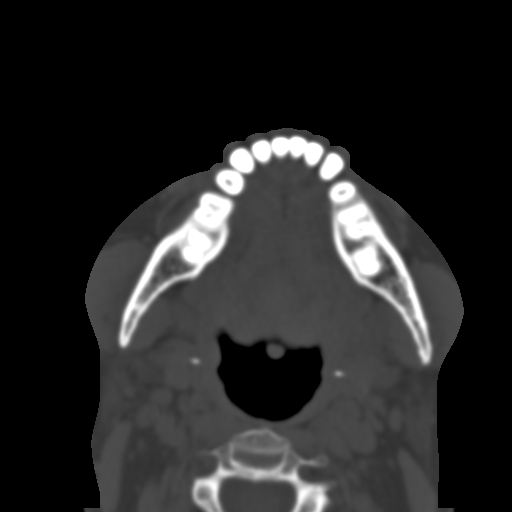
[im 29/85  bone]
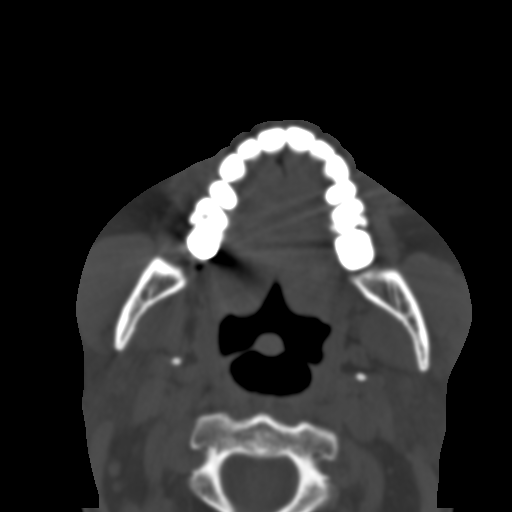
[im 38/85  brain]
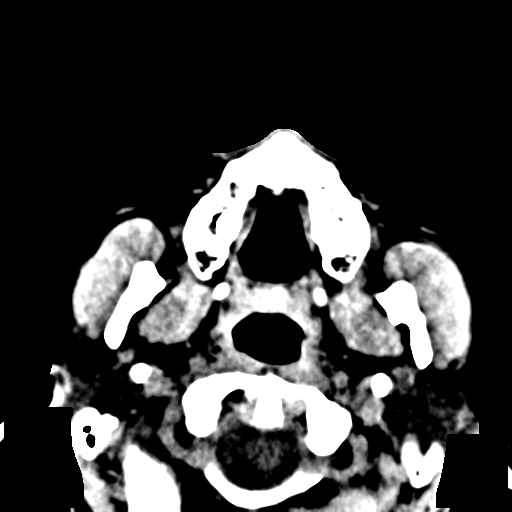
[im 38/85  bone]
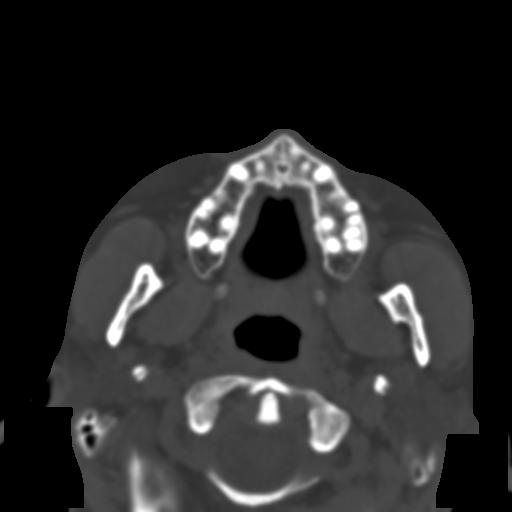
[im 47/85  bone]
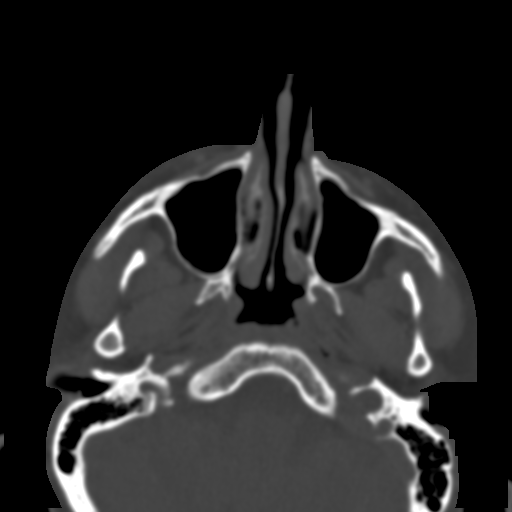
[im 56/85  bone]
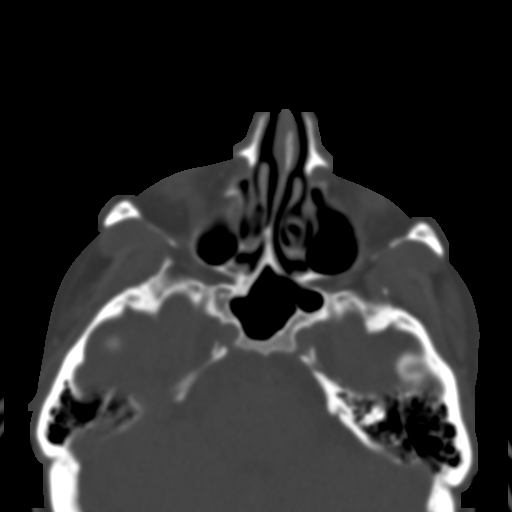
[im 64/85  bone]
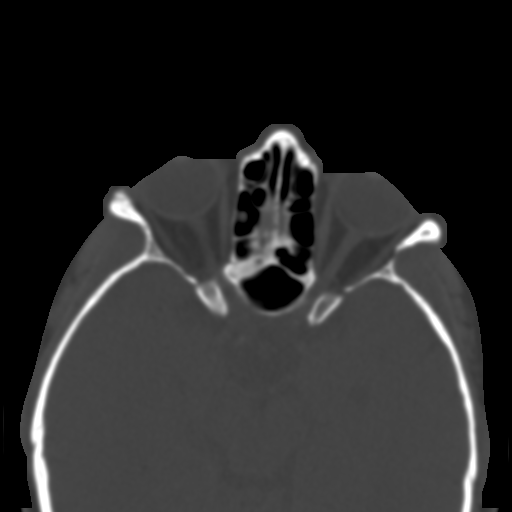
[im 70/85  brain]
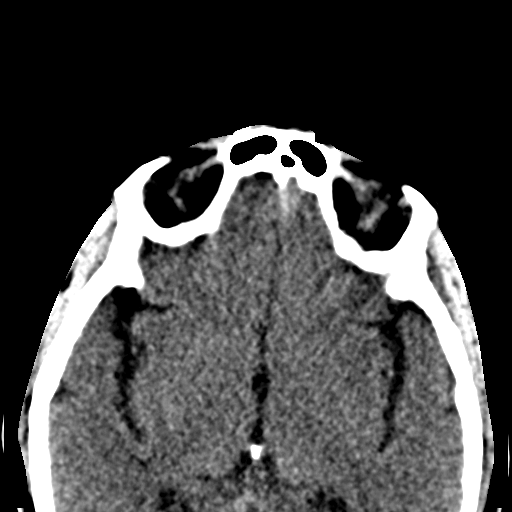
[im 70/85  bone]
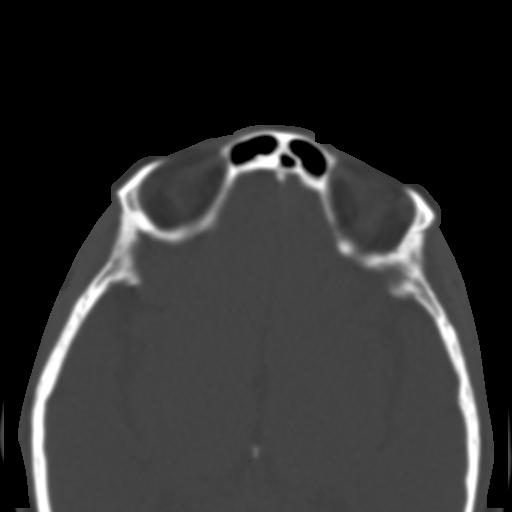
[im 79/85  bone]
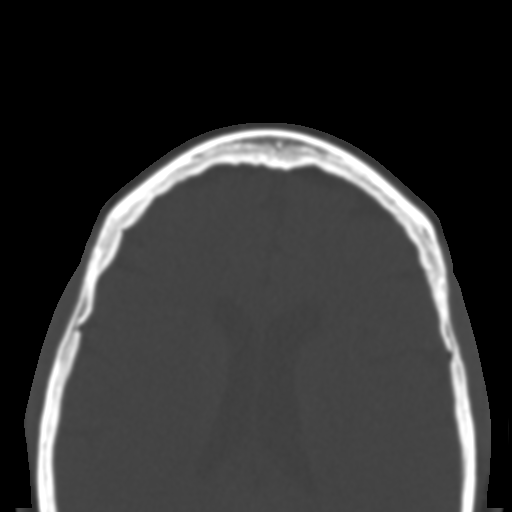

[Series 4: coronal soft · coronal · 0.33mm/px · 3 of 71 slices shown]
[im 24/71  bone]
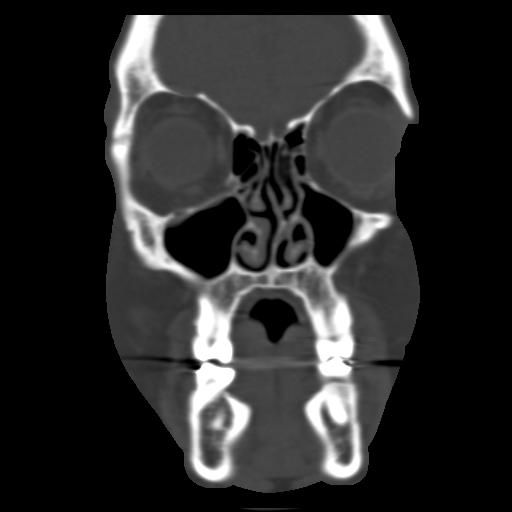
[im 32/71  bone]
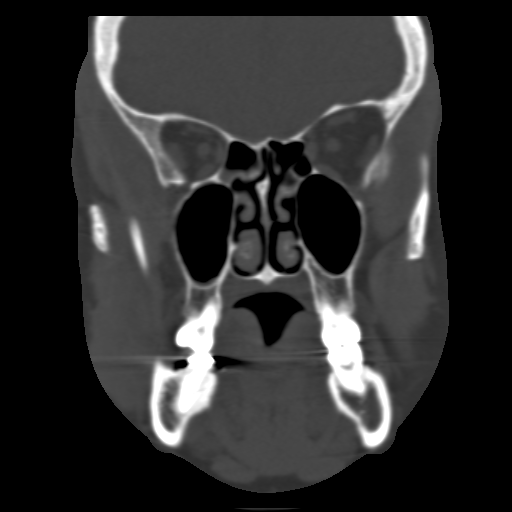
[im 39/71  bone]
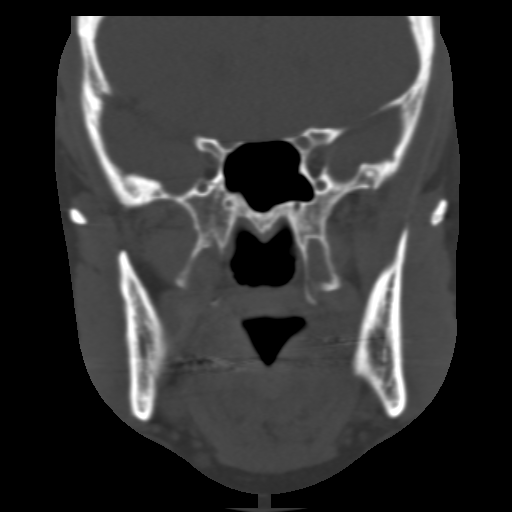

[Series 5: sagittal soft · sagittal · 0.27mm/px · 3 of 81 slices shown]
[im 27/81  bone]
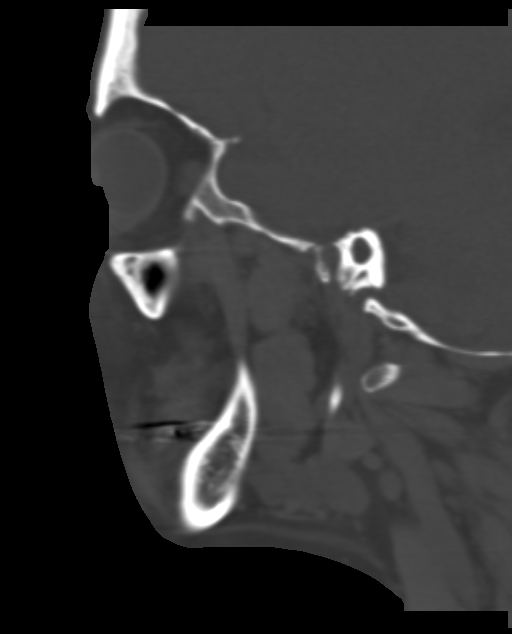
[im 41/81  bone]
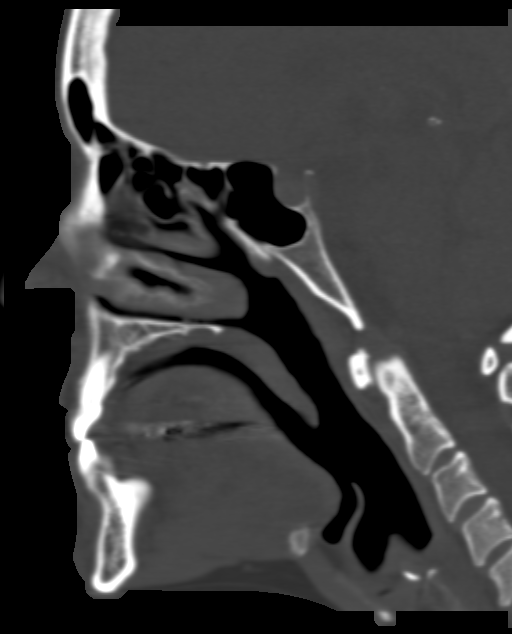
[im 54/81  bone]
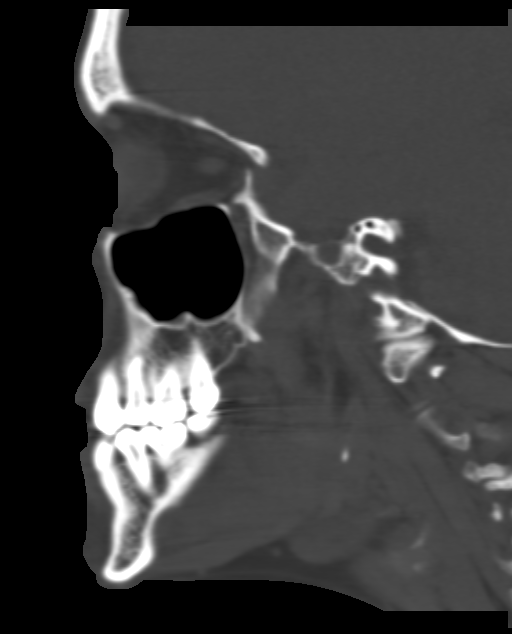

[16 of 47 positions shown; findings below may reference images not displayed]

FINDINGS: There is no facial bone fracture. The TMJs are located. There is
some degenerative change about the left TMJ with tiny subchondral
cysts and some sclerosis in the mandibular condyle identified. The
globes are intact and the lenses are located. Orbital fat is clear.
Extra-ocular muscles and optic nerves have a normal appearance.
Imaged intracranial contents appear normal.
IMPRESSION: No acute abnormality.  Normal-appearing right eye.

Degenerative change about the left TMJ.
